# Patient Record
Sex: Female | Born: 1935 | Race: White | Hispanic: No
Health system: Southern US, Community
[De-identification: ages and names within clinical notes are randomized; demographics above are authoritative.]

---

## 2008-09-07 ENCOUNTER — Inpatient Hospital Stay: Payer: Self-pay | Admitting: Specialist

## 2008-09-13 ENCOUNTER — Encounter: Payer: Self-pay | Admitting: Internal Medicine

## 2008-09-27 ENCOUNTER — Encounter: Payer: Self-pay | Admitting: Internal Medicine

## 2008-10-28 ENCOUNTER — Encounter: Payer: Self-pay | Admitting: Internal Medicine

## 2009-02-28 ENCOUNTER — Inpatient Hospital Stay: Payer: Self-pay | Admitting: Internal Medicine

## 2011-05-09 IMAGING — CT CT HEAD WITHOUT CONTRAST
1 series · 15 of 30 positions shown, 19 images · non-contrast
Comparison: none

REASON FOR EXAM: fall
COMMENTS:

PROCEDURE:     CT  - CT HEAD WITHOUT CONTRAST  - February 28, 2009  [DATE]
RESULT:     Comparison:  None
TECHNIQUE: Multiple axial images from the foramen magnum to the vertex were
obtained without IV contrast.

[Series 2: soft tissue · axial · 0.39mm/px · z∈[+835,+970]mm · 15 of 30 slices shown, 19 images]
[im 2/30  brain]
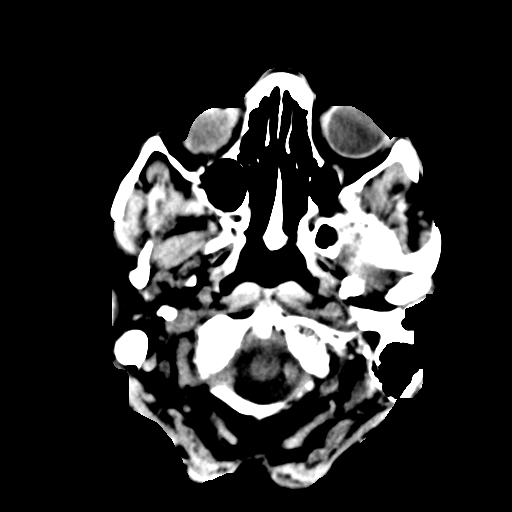
[im 2/30  bone]
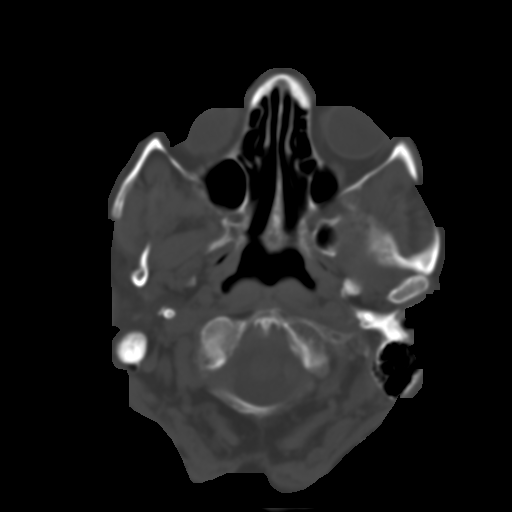
[im 4/30  brain]
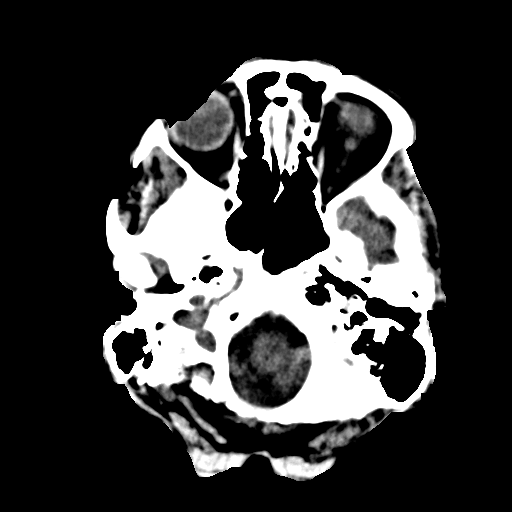
[im 6/30  brain]
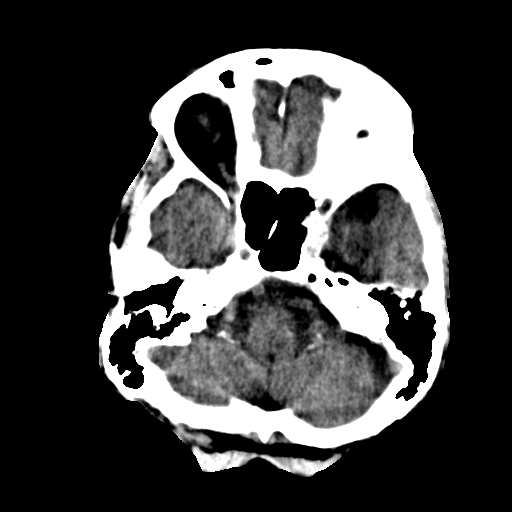
[im 8/30  brain]
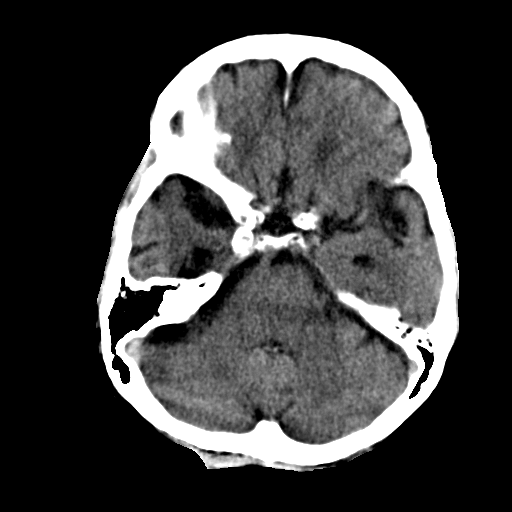
[im 10/30  brain]
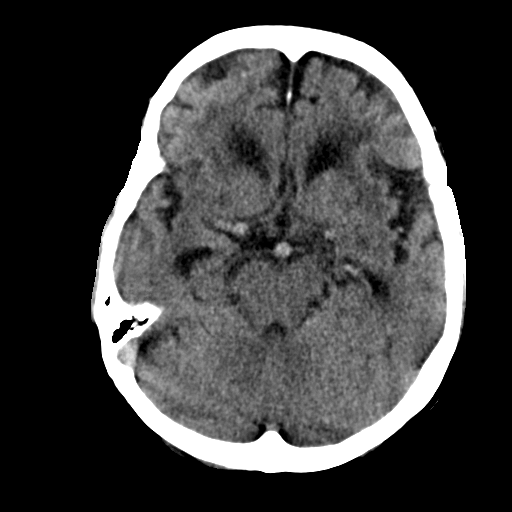
[im 10/30  bone]
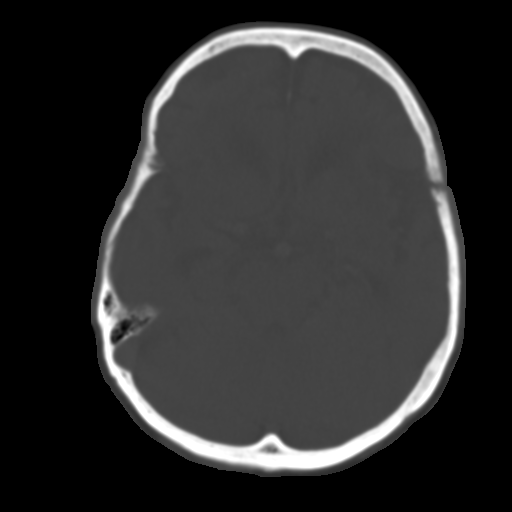
[im 12/30  brain]
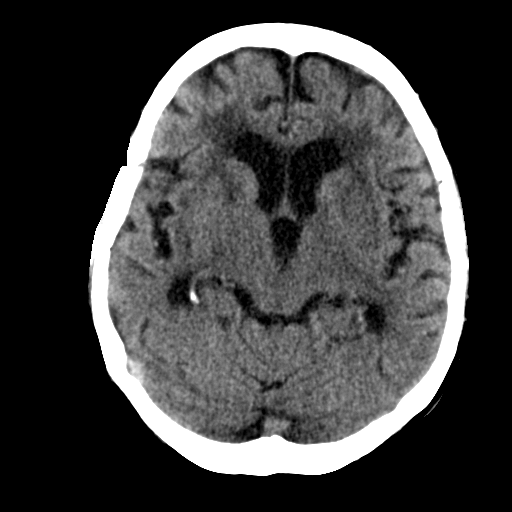
[im 14/30  brain]
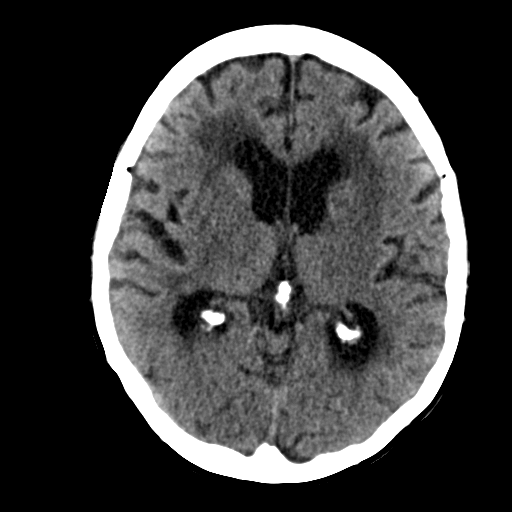
[im 16/30  brain]
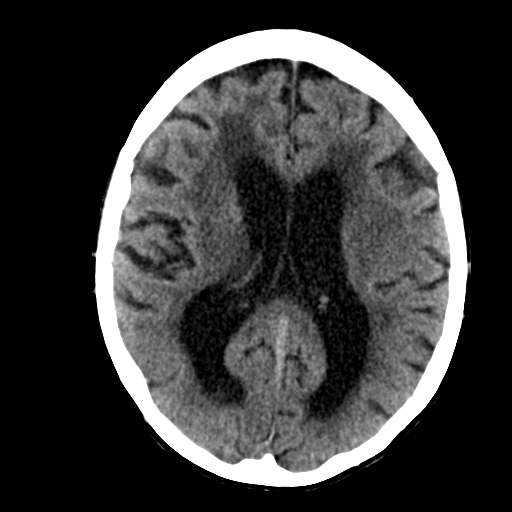
[im 17/30  brain]
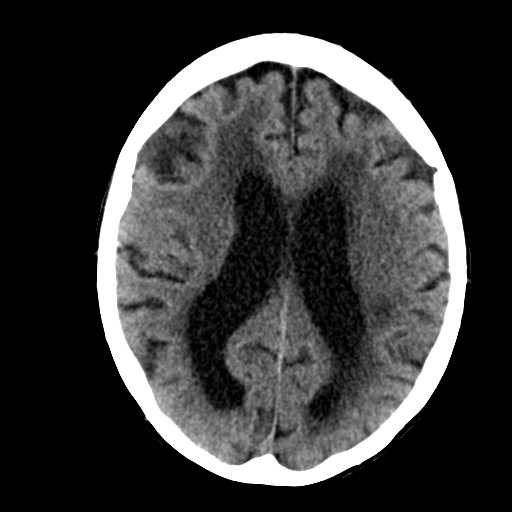
[im 17/30  bone]
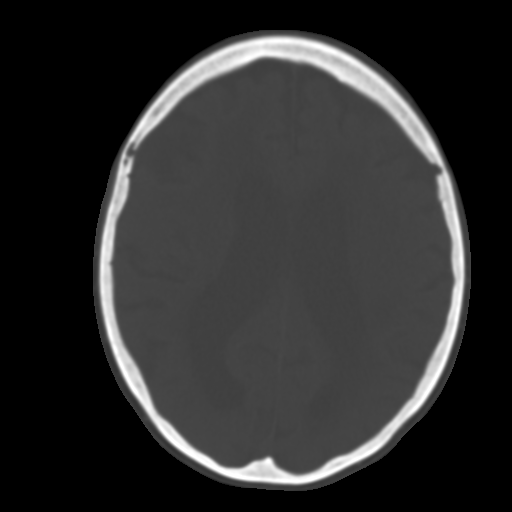
[im 19/30  brain]
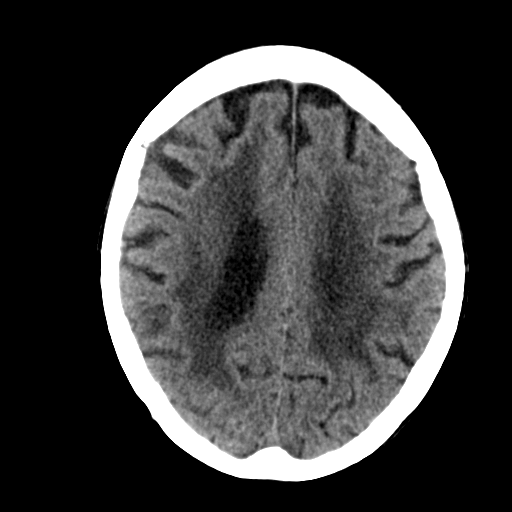
[im 21/30  brain]
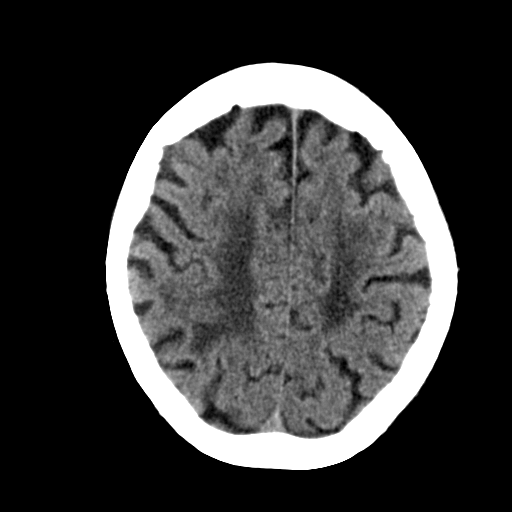
[im 23/30  brain]
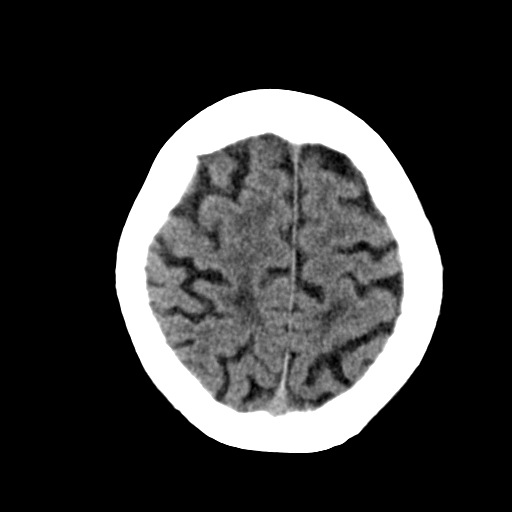
[im 25/30  brain]
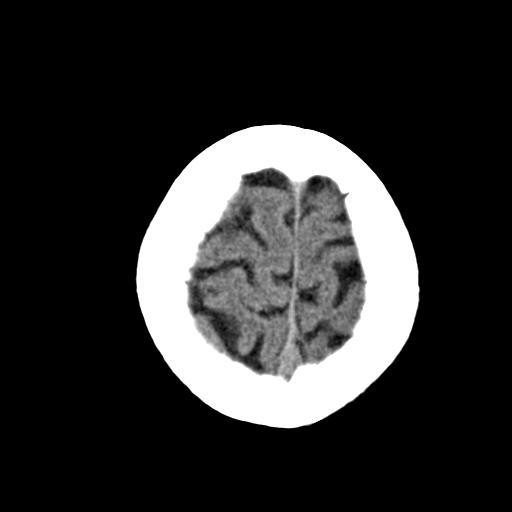
[im 25/30  bone]
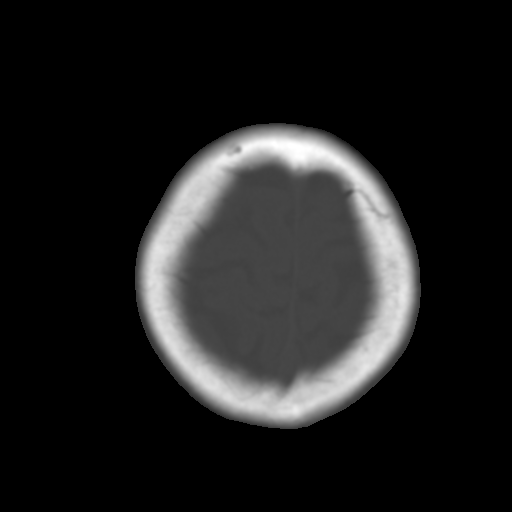
[im 27/30  brain]
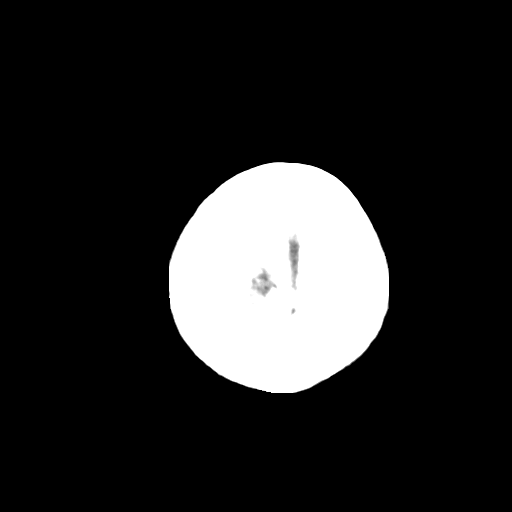
[im 29/30  brain]
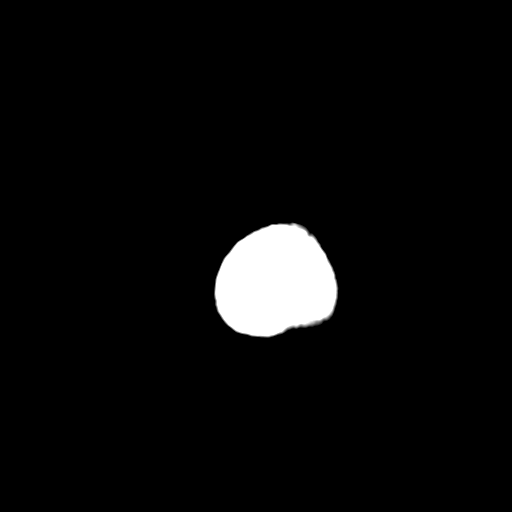

[15 of 30 positions shown; findings below may reference images not displayed]

FINDINGS: There is no evidence of mass effect, midline shift, or extra-axial fluid
collections.  There is no evidence of a space-occupying lesion or
intracranial hemorrhage. There is no evidence of a cortical-based area of
acute infarction. There is an age indeterminate right basal ganglia lacunar
infarct. There is generalized cerebral atrophy. There is periventricular
white matter low attenuation likely secondary to microangiopathy.

The ventricles and sulci are appropriate for the patient's age. The basal
cisterns are patent.

Visualized portions of the orbits are unremarkable. The visualized portions
of the paranasal sinuses and mastoid air cells are unremarkable.
Cerebrovascular atherosclerotic calcifications are noted.

The osseous structures are unremarkable.
IMPRESSION: Age indeterminate right basal ganglia lacunar infarct. Otherwise no acute
intracranial pathology.

CT can underestimate ischemia in the first 24 hours after the event. If
there is clinical concern for an acute infarct, a followup MRI or repeat CT
scan in 24 hours may provide additional information.

## 2012-01-06 ENCOUNTER — Inpatient Hospital Stay: Payer: Self-pay | Admitting: Internal Medicine

## 2012-01-06 ENCOUNTER — Ambulatory Visit: Payer: Self-pay | Admitting: Internal Medicine

## 2012-01-06 LAB — CBC
MCH: 30.4 pg (ref 26.0–34.0)
MCHC: 33.2 g/dL (ref 32.0–36.0)
MCV: 92 fL (ref 80–100)
Platelet: 210 10*3/uL (ref 150–440)
RDW: 14.4 % (ref 11.5–14.5)
WBC: 7.1 10*3/uL (ref 3.6–11.0)

## 2012-01-06 LAB — BASIC METABOLIC PANEL
Anion Gap: 13 (ref 7–16)
BUN: 20 mg/dL — ABNORMAL HIGH (ref 7–18)
Calcium, Total: 8.1 mg/dL — ABNORMAL LOW (ref 8.5–10.1)
Co2: 16 mmol/L — ABNORMAL LOW (ref 21–32)
EGFR (African American): 50 — ABNORMAL LOW
Osmolality: 301 (ref 275–301)
Potassium: 4 mmol/L (ref 3.5–5.1)

## 2012-01-06 LAB — CK TOTAL AND CKMB (NOT AT ARMC): CK, Total: 51 U/L (ref 21–215)

## 2012-01-06 LAB — TROPONIN I: Troponin-I: 0.03 ng/mL

## 2012-01-29 ENCOUNTER — Ambulatory Visit: Payer: Self-pay | Admitting: Internal Medicine

## 2012-01-29 DEATH — deceased

## 2014-03-16 IMAGING — CR DG CHEST 1V PORT
1 series · 1 of 1 positions shown · non-contrast
Comparison: none

REASON FOR EXAM: post intubation
COMMENTS:

[ap]
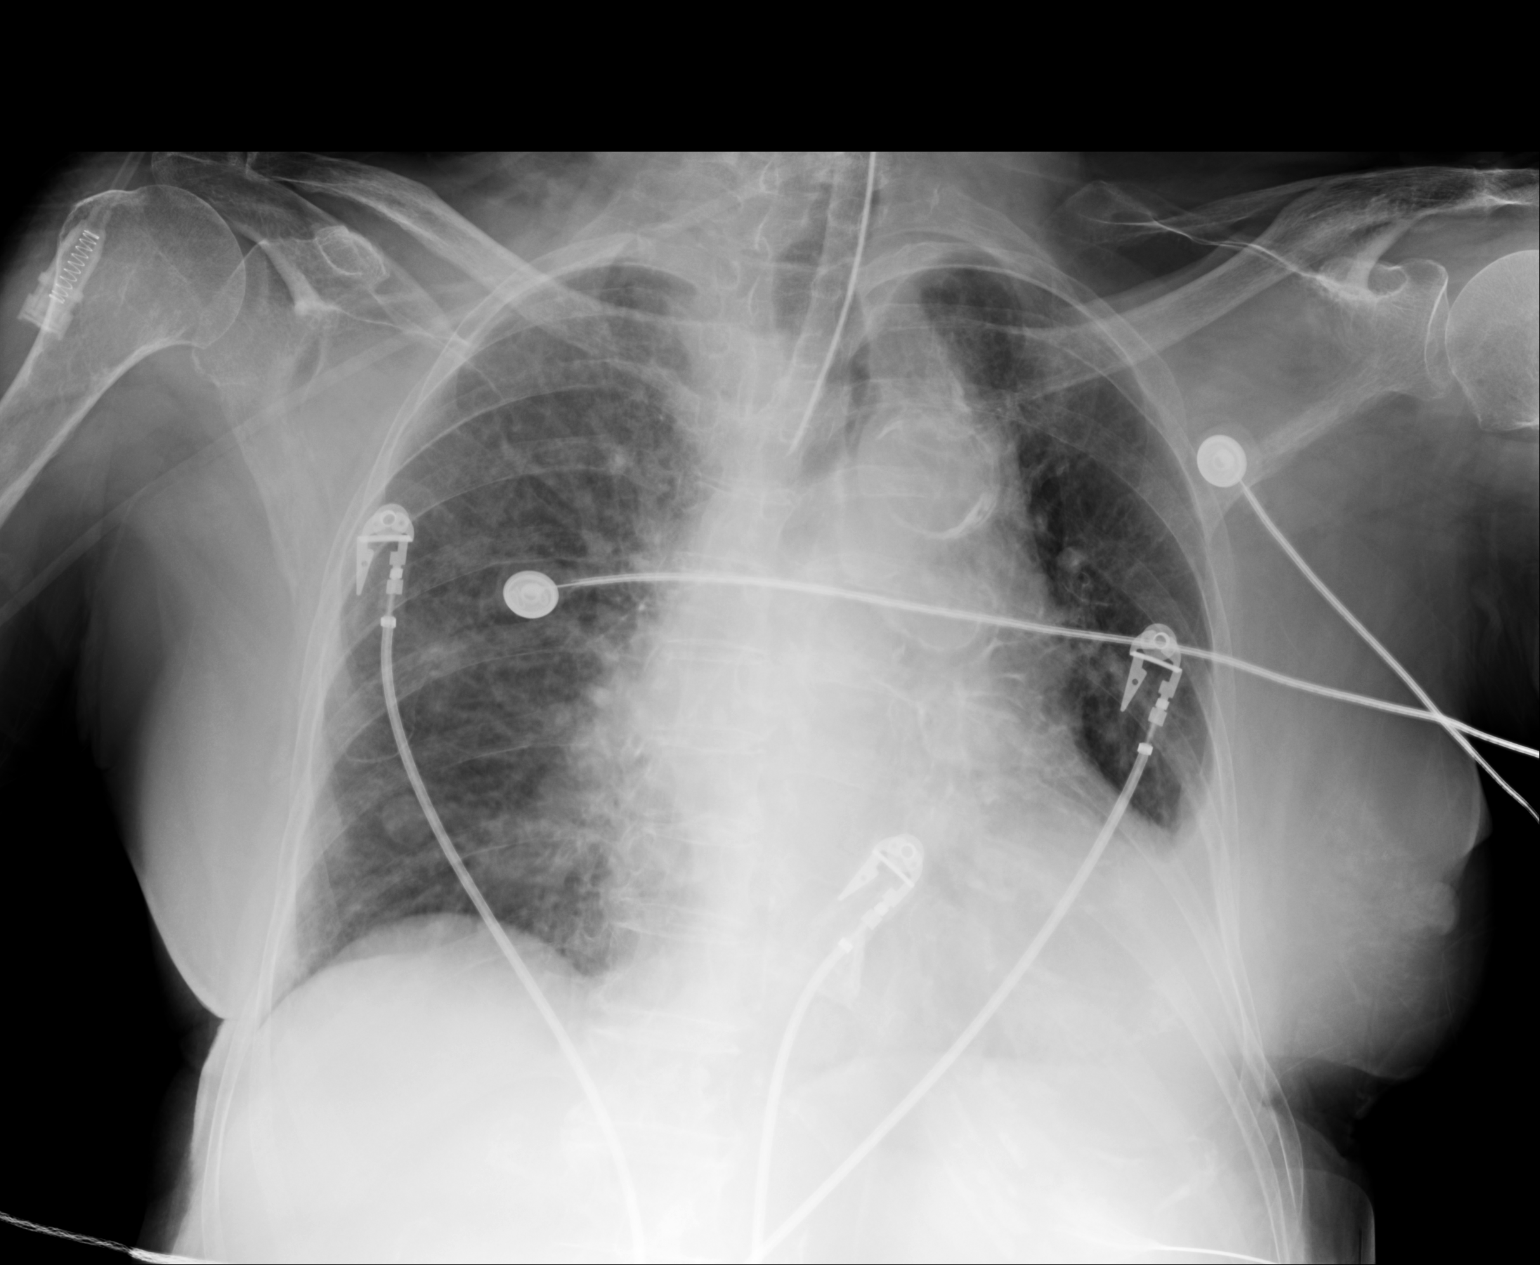

[1 of 1 positions shown; findings below may reference images not displayed]

PROCEDURE:     DXR - DXR PORTABLE CHEST SINGLE VIEW  - January 06, 2012  [DATE]

RESULT:     Comparison is made to the study 02 March, 2009.

An endotracheal tube is present the tip projecting at the level of the
aortic arch. This appears to be above the level of carina. There is some
density at the left lung base possibly with a small left pleural effusion
versus infiltrate or atelectasis. There is mild diffuse pulmonary vascular
congestion and interstitial edema. Nodular density at the right lung base
may represent a prominent nipple shadow. Atherosclerotic calcification is
present within the aortic arch. The heart size appears to be mildly
enlarged. Monitoring electrodes are present.
IMPRESSION: 1. Patient is rotated toward the left. Endotracheal tube is present as
described. Normal nodularity of the right lung base may represent a nipple
shadow. Possible trace left pleural effusion with mild interstitial edema.

[REDACTED]

## 2014-07-17 NOTE — Consult Note (Signed)
    Comments   Spontaneous respirations noted. Extubated. Family at bedside. Pt appears comfortable. HR in 20s. Death appears Advertising account executiveeminent. May consider admission for comfort care if needed.     Electronic Signatures for Addendum Section:  Phifer, Harriett SineNancy (MD) (Signed Addendum 09-Oct-13 17:01)  I was present for extubation. Agree with assessment and plan as outlined in above note.   Electronic Signatures: Borders, Daryl EasternJoshua R (NP)  (Signed 09-Oct-13 16:51)  Authored: Palliative Care   Last Updated: 09-Oct-13 17:01 by Phifer, Harriett SineNancy (MD)

## 2014-07-17 NOTE — H&P (Signed)
PATIENT NAME:  Jackie Jensen, Jackie Jensen MR#:  [id number] DATE OF BIRTH:  Dec 03, 1917  DATE OF ADMISSION:  01/06/2012  PRIMARY CARE PHYSICIAN: Dr. Arlana Pouchate  REFERRING PHYSICIAN: Dr. Carollee MassedKaminski   CHIEF COMPLAINT: Cardiac arrest today.   HISTORY OF PRESENT ILLNESS: 81 year old Caucasian female with history of cerebrovascular accident, dementia, debility, right eye blindness, cataract was brought to ED due to cardiac arrest after the patient went ophthalmology clinic and was found to be pulseless and apneic. Cardiopulmonary resuscitation was performed and patient was transferred to ED for further evaluation and treatment. Patient was defibrillated once. Since patient has respiratory failure she was intubated and placed on ventilator. Patient's blood pressure could not be measured and patient was noted to have a third degree heart block. External pacemaker was placed. Dr. Clemens Catholicagsdale called cardiologist and pulmonologist for consultation, however, patient's family member including her son and grandson verbalized that patient did not want to be resuscitated nor would she want to be sustained on current therapy so patient was extubated and placed for comfort care. Also palliative care consult was requested. Mr. Gae GallopBolder evaluated patient and suggested comfort care and DO NOT RESUSCITATE. After withdrawal of treatment, patient's heart rate is about 20s. Patient has been in ED for about two hours, still has spontaneous breathing with bradycardia. Dr. Carollee MassedKaminski requests hospitalist admit the patient so that patient can be transferred from ED to floor.    PAST MEDICAL HISTORY:  1. Cerebrovascular accident. 2. Rhabdomyolysis. 3. Debility. 4. Right eye blind.  5. Cataract.  6. Femoral neck fracture.  7. Remote hysterectomy.    SOCIAL HISTORY: Nursing home resident.   FAMILY HISTORY: Unknown.   REVIEW OF SYSTEMS: Unable to obtain.   PHYSICAL EXAMINATION:  VITALS: Unable to obtain due to patient's comfort care status.    GENERAL: Patient is unresponsive, has spontaneous breathing.    HEENT: Pupils are round, dilated, no reaction to light.   NECK: Supple. No JVD or carotid bruit. There is irregular slow carotid pulse.   CARDIOVASCULAR: Patient has a very slow heart rate. It is very difficult to hear the heart sounds. No murmurs or gallops.   PULMONARY: Bilateral air entry. Very slow breathing effort. No use of accessory muscles to breathe.   ABDOMEN: Soft, very weak bowel sounds. No distention.   EXTREMITIES: No edema, clubbing, or cyanosis.   SKIN: No rash or jaundice.   NEUROLOGIC: Patient is unresponsive, unable to examine.   LABORATORY, DIAGNOSTIC AND RADIOLOGICAL DATA: Chest x-ray possible trace left pleural effusion with mild interstitial edema. ABG showed pH 7.27, pCO2 36, pO2 198 on ventilator with FiO2 100%. CBC normal. Glucose 165, BUN 20, creatinine 1.11, sodium 148, potassium 4.0, chloride 119, bicarbonate 16, BNP 1658, troponin 0.06. EKG shows third degree block.   IMPRESSION:  1. Cardiac arrest.  2. Acute respiratory failure.  3. Third-degree heart block.  4. History of CVA. 5. Dementia.   PLAN OF TREATMENT: Patient will be admitted to medical floor. We will continue comfort care. Discussed with the patient's family member.   TIME SPENT: About 55 minutes.   ____________________________ Shaune PollackQing Kevina Piloto, MD qc:cms D: 01/06/2012 18:48:40 ET T: 01/07/2012 05:09:06 ET JOB#: [id number]  cc: Shaune PollackQing Bowden Boody, MD, <Dictator> Jillene Bucksenny C. Arlana Pouchate, MD Shaune PollackQING Jaquia Benedicto MD ELECTRONICALLY SIGNED 01/07/2012 22:31

## 2014-07-17 NOTE — Discharge Summary (Signed)
PATIENT NAME:  Jackie Jensen, Jackie Jensen MR#:  [id number] DATE OF BIRTH:  March 14, 1918  DATE OF ADMISSION:  01/06/2012 DATE OF DISCHARGE:  01/07/2012  ADMITTING DIAGNOSES:   Cardiac arrest, cardiorespiratory arrest  DISCHARGE DIAGNOSES:  Cardiorespiratory arrest, complete heart block, dementia.   DISCHARGE CONDITION: Poor.   DISCHARGE MEDICATIONS:  1. The patient is to continue Roxanol 20 mg in 1 mL solution 0.25 to 0.5 mL p.o. sublingually every 1 to 2 hours as needed.  2. Lorazepam 0.5 mg to 1.0 mg p.o. sublingually every 2 to 4 hours as needed.  3. Ranitidine 150 mg p.o. twice daily.  4. ABHR suppository which is Ativan 0.5 mg, Benadryl 12.5 mg, Haldol 0.5 mg and Reglan 10 mg per rectum every 4 to 6 hours as needed.   CONSULTANTS:  Palliative care.   RADIOLOGIC STUDIES:  Chest x-ray, portable single view 01/06/2012, showed the patient was rotated toward the left, a T-tube who was present and described normal nodularity of the right lung base may represent nipple shadow, possible trace left pleural effusion with mild interstitial edema noted.   HISTORY:  The patient is a 81 year old Caucasian female with past medical history significant for history of dementia who presented to the hospital after cardiac arrest. Please refer to Dr. Nicky Pughhen's admission note on 01/06/2012. Apparently the patient was in the ophthalmology clinic and was found to be pulseless and apneic. Cardiopulmonary resuscitation was performed and the patient was transferred to the Emergency Room for further evaluation and treatment. She was defibrillated and was intubated and placed on ventilator then family members verbalized the patient did not want to have to be resuscitated and decided to extubate patient. The patient's heart rate remained in 20s. The patient was extubated and admitted for comfort care measures and her EKG showed sinus bradia at the rate of 20 and complete a heart block, and idioventricular rhythm, left axis deviation as  well as left bundle branch block. Initial labs done on the 01/06/2012 showed glucose of 165, BUN 20, sodium 148, otherwise BMP was unremarkable, however, patient's bicarbonate level was low at 16, estimated GFR for non-African American would be 43. The patient's beta natriuretic peptide was elevated to 1658. Liver enzymes were not checked and cardiac enzymes, first set, was normal. CBC was unremarkable. ABGs were done on 100% FiO2 showed pH of 7.27, pCO2 36, pO2 198, saturation was 99.6%    The patient was admitted to regular medical floor for comfort care measures. She was continued on oxygen therapy and consultation with  Dr. Harvie JuniorPhifer was obtained. Dr. Harvie JuniorPhifer felt that the patient has significant and extensive cardiac damage as well as encephalopathy and after discussion with family, they decided to transfer the patient to a hospice home, bed was available and the patient was discharged on 01/07/2012.     ____________________________ Katharina Caperima Shawne Bulow, MD rv:ljs D: 01/07/2012 15:10:13 ET T: 2012-02-07 12:39:38 ET JOB#: [id number]  cc: Katharina Caperima Abigail Teall, MD, <Dictator> Jillene Bucksenny C. Arlana Pouchate, MD Katharina CaperIMA Patricia Fargo MD ELECTRONICALLY SIGNED 01/30/2012 7:56
